# Patient Record
Sex: Male | Born: 2000 | Race: White | Hispanic: No | Marital: Single | Smoking: Never smoker
Health system: Southern US, Community
[De-identification: ages and names within clinical notes are randomized; demographics above are authoritative.]

## PROBLEM LIST (undated history)

## (undated) DIAGNOSIS — T7840XA Allergy, unspecified, initial encounter: Secondary | ICD-10-CM

## (undated) DIAGNOSIS — S92309A Fracture of unspecified metatarsal bone(s), unspecified foot, initial encounter for closed fracture: Secondary | ICD-10-CM

## (undated) DIAGNOSIS — Z8679 Personal history of other diseases of the circulatory system: Secondary | ICD-10-CM

## (undated) DIAGNOSIS — H539 Unspecified visual disturbance: Secondary | ICD-10-CM

## (undated) HISTORY — DX: Allergy, unspecified, initial encounter: T78.40XA

---
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
## 2021-08-26 ENCOUNTER — Ambulatory Visit: Payer: Federal, State, Local not specified - PPO

## 2021-08-26 ENCOUNTER — Ambulatory Visit (LOCAL_COMMUNITY_HEALTH_CENTER): Payer: Federal, State, Local not specified - PPO

## 2021-08-26 ENCOUNTER — Ambulatory Visit: Payer: Self-pay

## 2021-08-26 DIAGNOSIS — Z719 Counseling, unspecified: Secondary | ICD-10-CM

## 2021-08-26 DIAGNOSIS — Z23 Encounter for immunization: Secondary | ICD-10-CM

## 2021-08-26 NOTE — Progress Notes (Signed)
Pt is here for a tdap vaccine needed for [place] trip this month. Pt is eligible to have tdap vaccine per [facility]. Administered vaccine to right deltoid, tolerated well. Gave VIS and [facility]. M.Azlin Zilberman, LPN.
